# Patient Record
Sex: Female | Born: 1965 | Race: Black or African American | Hispanic: No | Marital: Married | Smoking: Never smoker
Health system: Southern US, Community
[De-identification: ages and names within clinical notes are randomized; demographics above are authoritative.]

## PROBLEM LIST (undated history)

## (undated) DIAGNOSIS — Z86018 Personal history of other benign neoplasm: Secondary | ICD-10-CM

## (undated) DIAGNOSIS — I10 Essential (primary) hypertension: Secondary | ICD-10-CM

## (undated) DIAGNOSIS — J3800 Paralysis of vocal cords and larynx, unspecified: Secondary | ICD-10-CM

## (undated) DIAGNOSIS — K219 Gastro-esophageal reflux disease without esophagitis: Secondary | ICD-10-CM

## (undated) DIAGNOSIS — E039 Hypothyroidism, unspecified: Secondary | ICD-10-CM

## (undated) HISTORY — PX: REPLACEMENT TOTAL KNEE: SUR1224

---

## 2021-03-06 ENCOUNTER — Emergency Department (HOSPITAL_COMMUNITY)
Admission: EM | Admit: 2021-03-06 | Discharge: 2021-03-06 | Disposition: A | Discharge status: Home / Self Care | Payer: Medicaid - Out of State | Attending: Emergency Medicine | Admitting: Emergency Medicine

## 2021-03-06 ENCOUNTER — Other Ambulatory Visit: Payer: Self-pay

## 2021-03-06 ENCOUNTER — Emergency Department (HOSPITAL_COMMUNITY): Payer: Medicaid - Out of State

## 2021-03-06 ENCOUNTER — Encounter (HOSPITAL_COMMUNITY): Payer: Self-pay | Admitting: *Deleted

## 2021-03-06 DIAGNOSIS — K219 Gastro-esophageal reflux disease without esophagitis: Secondary | ICD-10-CM | POA: Diagnosis not present

## 2021-03-06 DIAGNOSIS — I10 Essential (primary) hypertension: Secondary | ICD-10-CM | POA: Diagnosis not present

## 2021-03-06 DIAGNOSIS — R1031 Right lower quadrant pain: Secondary | ICD-10-CM

## 2021-03-06 DIAGNOSIS — E039 Hypothyroidism, unspecified: Secondary | ICD-10-CM | POA: Insufficient documentation

## 2021-03-06 DIAGNOSIS — R102 Pelvic and perineal pain: Secondary | ICD-10-CM | POA: Insufficient documentation

## 2021-03-06 HISTORY — DX: Gastro-esophageal reflux disease without esophagitis: K21.9

## 2021-03-06 HISTORY — DX: Essential (primary) hypertension: I10

## 2021-03-06 HISTORY — DX: Paralysis of vocal cords and larynx, unspecified: J38.00

## 2021-03-06 HISTORY — DX: Personal history of other benign neoplasm: Z86.018

## 2021-03-06 HISTORY — DX: Hypothyroidism, unspecified: E03.9

## 2021-03-06 LAB — COMPREHENSIVE METABOLIC PANEL
ALT: 25 U/L (ref 0–44)
AST: 21 U/L (ref 15–41)
Albumin: 4.3 g/dL (ref 3.5–5.0)
Alkaline Phosphatase: 91 U/L (ref 38–126)
Anion gap: 11 (ref 5–15)
BUN: 26 mg/dL — ABNORMAL HIGH (ref 6–20)
CO2: 27 mmol/L (ref 22–32)
Calcium: 9.3 mg/dL (ref 8.9–10.3)
Chloride: 97 mmol/L — ABNORMAL LOW (ref 98–111)
Creatinine, Ser: 0.95 mg/dL (ref 0.44–1.00)
GFR, Estimated: >60 mL/min (ref >60)
Glucose, Bld: 121 mg/dL — ABNORMAL HIGH (ref 70–99)
Potassium: 3.9 mmol/L (ref 3.5–5.1)
Sodium: 135 mmol/L (ref 135–145)
Total Bilirubin: 0.6 mg/dL (ref 0.3–1.2)
Total Protein: 8.9 g/dL — ABNORMAL HIGH (ref 6.5–8.1)

## 2021-03-06 LAB — CBC WITH DIFFERENTIAL/PLATELET
Abs Immature Granulocytes: 0.02 10*3/uL (ref 0.00–0.07)
Basophils Absolute: 0 10*3/uL (ref 0.0–0.1)
Basophils Relative: 1 %
Eosinophils Absolute: 0.1 10*3/uL (ref 0.0–0.5)
Eosinophils Relative: 1 %
HCT: 40.1 % (ref 36.0–46.0)
Hemoglobin: 12.8 g/dL (ref 12.0–15.0)
Immature Granulocytes: 0 %
Lymphocytes Relative: 41 %
Lymphs Abs: 2.4 10*3/uL (ref 0.7–4.0)
MCH: 29 pg (ref 26.0–34.0)
MCHC: 31.9 g/dL (ref 30.0–36.0)
MCV: 90.9 fL (ref 80.0–100.0)
Monocytes Absolute: 0.5 10*3/uL (ref 0.1–1.0)
Monocytes Relative: 8 %
Neutro Abs: 2.9 10*3/uL (ref 1.7–7.7)
Neutrophils Relative %: 49 %
Platelets: 186 10*3/uL (ref 150–400)
RBC: 4.41 MIL/uL (ref 3.87–5.11)
RDW: 13.1 % (ref 11.5–15.5)
WBC: 5.9 10*3/uL (ref 4.0–10.5)
nRBC: 0 % (ref 0.0–0.2)

## 2021-03-06 LAB — URINALYSIS, ROUTINE W REFLEX MICROSCOPIC
Bilirubin Urine: NEGATIVE
Glucose, UA: NEGATIVE mg/dL
Hgb urine dipstick: NEGATIVE
Ketones, ur: NEGATIVE mg/dL
Leukocytes,Ua: NEGATIVE
Nitrite: NEGATIVE
Protein, ur: NEGATIVE mg/dL
Specific Gravity, Urine: 1.014 (ref 1.005–1.030)
pH: 6 (ref 5.0–8.0)

## 2021-03-06 MED ORDER — HYDROCODONE-ACETAMINOPHEN 5-325 MG PO TABS: 1.0000 | ORAL_TABLET | ORAL | 0 refills | Status: AC | PRN

## 2021-03-06 MED ORDER — FENTANYL CITRATE (PF) 100 MCG/2ML IJ SOLN
50.0000 ug | INTRAMUSCULAR | Status: DC | PRN
  Administered 2021-03-06: 50 ug via INTRAVENOUS
  Filled 2021-03-06: qty 2

## 2021-03-06 MED ORDER — IOHEXOL 300 MG/ML  SOLN
100.0000 mL | Freq: Once | INTRAMUSCULAR | Status: AC | PRN
  Administered 2021-03-06: 100 mL via INTRAVENOUS

## 2021-03-06 NOTE — Discharge Instructions (Addendum)
Follow-up closely with your doctor.  If CT scan did not show any concerning findings at this time.  Your blood work was reassuring.  Stay well-hydrated, use Tylenol every 4 hours and if tolerated ibuprofen every 6 hours as needed for pain. Return for new or worsening signs or symptoms.

## 2021-03-06 NOTE — ED Provider Notes (Signed)
[facility] EMERGENCY DEPARTMENT Provider Note   CSN: [id number] Arrival date & time: 03/06/21  3664     History Chief Complaint  Patient presents with   Abdominal Pain    Heather Byrd is a 55 y.o. female.  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  Initially was intermittent no specific associations now fairly constant.  Patient unsure which ovary she has left.  Patient denies dysuria, vaginal discharge, vaginal bleeding.  History of ovarian cyst.  No fevers chills or vomiting.  No infectious symptoms or shortness of breath.      Past Medical History:  Diagnosis Date   Acid reflux    History of uterine fibroid    Hypertension    Hypothyroid    Paralyzed vocal cords     There are no problems to display for this patient.   Past Surgical History:  Procedure Laterality Date   CESAREAN SECTION     REPLACEMENT TOTAL KNEE Left      OB History   No obstetric history on file.     No family history on file.  Social History   Tobacco Use   Smoking status: Never   Smokeless tobacco: Never  Vaping Use   Vaping Use: Never used  Substance Use Topics   Alcohol use: Yes    Comment: 3-4 drinks on days she's off work   Drug use: Never    Home Medications Prior to Admission medications   Not on File    Allergies    Patient has no known allergies.  Review of Systems   Review of Systems  Constitutional:  Negative for chills and fever.  HENT:  Negative for congestion.   Eyes:  Negative for visual disturbance.  Respiratory:  Negative for shortness of breath.   Cardiovascular:  Negative for chest pain.  Gastrointestinal:  Positive for abdominal pain. Negative for vomiting.  Genitourinary:  Negative for dysuria and flank pain.  Musculoskeletal:   Negative for back pain, neck pain and neck stiffness.  Skin:  Negative for rash.  Neurological:  Negative for light-headedness and headaches.   Physical Exam Updated Vital Signs BP (!) 124/94   Pulse 64   Temp 97.6 F (36.4 C) (Oral)   Resp (!) 21   Ht 6' (1.829 m)   Wt 136.1 kg   SpO2 98%   BMI 40.69 kg/m   Physical Exam Vitals and nursing note reviewed.  Constitutional:      General: She is not in acute distress.    Appearance: She is well-developed.  HENT:     Head: Normocephalic and atraumatic.     Mouth/Throat:     Mouth: Mucous membranes are moist.  Eyes:     General:        Right eye: No discharge.        Left eye: No discharge.     Conjunctiva/sclera: Conjunctivae normal.  Neck:     Trachea: No tracheal deviation.  Cardiovascular:     Rate and Rhythm: Normal rate and regular rhythm.     Heart sounds: No murmur heard. Pulmonary:     Effort: Pulmonary effort is normal.     Breath sounds: Normal breath sounds.  Abdominal:     General: There is no distension.     Palpations: Abdomen is soft.  Tenderness: There is abdominal tenderness in the right lower quadrant. There is no guarding.  Musculoskeletal:     Cervical back: Normal range of motion and neck supple. No rigidity.  Skin:    General: Skin is warm.     Capillary Refill: Capillary refill takes less than 2 seconds.     Findings: No rash.  Neurological:     General: No focal deficit present.     Mental Status: She is alert.     Cranial Nerves: No cranial nerve deficit.  Psychiatric:        Mood and Affect: Mood normal.    ED Results / Procedures / Treatments   Labs (all labs ordered are listed, but only abnormal results are displayed) Labs Reviewed  COMPREHENSIVE METABOLIC PANEL - Abnormal; Notable for the following components:      Result Value   Chloride 97 (*)    Glucose, Bld 121 (*)    BUN 26 (*)    Total Protein 8.9 (*)    All other components within normal limits  URINALYSIS, ROUTINE  W REFLEX MICROSCOPIC - Abnormal; Notable for the following components:   APPearance CLOUDY (*)    All other components within normal limits  CBC WITH DIFFERENTIAL/PLATELET    EKG None  Radiology CT ABDOMEN PELVIS W CONTRAST  Result Date: 03/06/2021 CLINICAL DATA:  Right lower quadrant pain with appendicitis suspected EXAM: CT ABDOMEN AND PELVIS WITH CONTRAST TECHNIQUE: Multidetector CT imaging of the abdomen and pelvis was performed using the standard protocol following bolus administration of intravenous contrast. CONTRAST:  OMNIPAQUE IOHEXOL 300 MG/ML  SOLN COMPARISON:  None. FINDINGS: Lower chest: No acute finding. Mild scar-like appearance at the right middle lobe. Hepatobiliary: No focal liver abnormality.No evidence of biliary obstruction or stone. Pancreas: Unremarkable. Spleen: Unremarkable. Adrenals/Urinary Tract: Negative adrenals. No hydronephrosis or stone. Small cystic density in the lower pole right kidney. Unremarkable bladder. Stomach/Bowel:  No obstruction. No appendicitis. Vascular/Lymphatic: No acute vascular abnormality. No mass or adenopathy. Reproductive:Hysterectomy Other: No ascites or pneumoperitoneum. Musculoskeletal: No acute abnormalities. 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 IMPRESSION: No acute finding or explanation for symptoms.  Normal appendix. Electronically Signed   By: Marnee Spring M.D.   On: 03/06/2021 09:30    Procedures Procedures   Medications Ordered in ED Medications  fentaNYL (SUBLIMAZE) injection 50 mcg (50 mcg Intravenous Given 03/06/21 0755)  iohexol (OMNIPAQUE) 300 MG/ML solution 100 mL (100 mLs Intravenous Contrast Given 03/06/21 9678)    ED Course  I have reviewed the triage vital signs and the nursing notes.  Pertinent labs & imaging results that were available during my care of the patient were reviewed by me and considered in my medical decision making (see chart for  details).    MDM Rules/Calculators/A&P                           Patient with partial hysterectomy history presents with worsening pressure and pain right lower abdominal pelvic area including bladder.  Discussed differential diagnosis including urine infection, constipation, kidney stone, tumor related, appendicitis, other.  Plan for blood work and CT scan for further delineation given worsening signs and symptoms.  Fentanyl ordered for pain.  Urine pending.  Urinalysis reviewed no signs of significant bleeding or infection.  Blood work reviewed normal white blood cell count, normal hemoglobin, electrolytes unremarkable.  Pain meds given in the ER.  CT scan results reviewed showing  no acute abnormalities no appendicitis, no bowel obstruction.  Patient stable for follow-up with primary doctor as needed gastroenterology for further work-up. Final Clinical Impression(s) / ED Diagnoses Final diagnoses:  Right lower quadrant abdominal pain    Rx / DC Orders ED Discharge Orders     None        Blane Ohara, MD 03/06/21 1014

## 2021-03-06 NOTE — ED Triage Notes (Signed)
Pt c/o right lower abdominal pain x 3 days. Denies n/v/d, dysuria, vaginal discharge/bleeding. Reports hx of ovarian cysts.

## 2021-07-18 ENCOUNTER — Other Ambulatory Visit: Payer: Self-pay

## 2021-07-18 ENCOUNTER — Encounter (HOSPITAL_COMMUNITY): Payer: Self-pay | Admitting: *Deleted

## 2021-07-18 ENCOUNTER — Emergency Department (HOSPITAL_COMMUNITY)
Admission: EM | Admit: 2021-07-18 | Discharge: 2021-07-18 | Disposition: A | Discharge status: Home / Self Care | Payer: Medicaid - Out of State | Attending: Emergency Medicine | Admitting: Emergency Medicine

## 2021-07-18 ENCOUNTER — Emergency Department (HOSPITAL_COMMUNITY): Payer: Medicaid - Out of State

## 2021-07-18 DIAGNOSIS — E039 Hypothyroidism, unspecified: Secondary | ICD-10-CM | POA: Diagnosis not present

## 2021-07-18 DIAGNOSIS — R1084 Generalized abdominal pain: Secondary | ICD-10-CM | POA: Diagnosis present

## 2021-07-18 DIAGNOSIS — K529 Noninfective gastroenteritis and colitis, unspecified: Secondary | ICD-10-CM | POA: Insufficient documentation

## 2021-07-18 DIAGNOSIS — Z96652 Presence of left artificial knee joint: Secondary | ICD-10-CM | POA: Diagnosis not present

## 2021-07-18 DIAGNOSIS — E876 Hypokalemia: Secondary | ICD-10-CM | POA: Insufficient documentation

## 2021-07-18 DIAGNOSIS — I10 Essential (primary) hypertension: Secondary | ICD-10-CM | POA: Insufficient documentation

## 2021-07-18 LAB — CBC WITH DIFFERENTIAL/PLATELET
Abs Immature Granulocytes: 0.03 10*3/uL (ref 0.00–0.07)
Basophils Absolute: 0 10*3/uL (ref 0.0–0.1)
Basophils Relative: 0 %
Eosinophils Absolute: 0 10*3/uL (ref 0.0–0.5)
Eosinophils Relative: 0 %
HCT: 41.9 % (ref 36.0–46.0)
Hemoglobin: 13.7 g/dL (ref 12.0–15.0)
Immature Granulocytes: 0 %
Lymphocytes Relative: 14 %
Lymphs Abs: 1.2 10*3/uL (ref 0.7–4.0)
MCH: 29.6 pg (ref 26.0–34.0)
MCHC: 32.7 g/dL (ref 30.0–36.0)
MCV: 90.5 fL (ref 80.0–100.0)
Monocytes Absolute: 0.3 10*3/uL (ref 0.1–1.0)
Monocytes Relative: 3 %
Neutro Abs: 7.1 10*3/uL (ref 1.7–7.7)
Neutrophils Relative %: 83 %
Platelets: 247 10*3/uL (ref 150–400)
RBC: 4.63 MIL/uL (ref 3.87–5.11)
RDW: 13 % (ref 11.5–15.5)
WBC: 8.6 10*3/uL (ref 4.0–10.5)
nRBC: 0 % (ref 0.0–0.2)

## 2021-07-18 LAB — COMPREHENSIVE METABOLIC PANEL
ALT: 30 U/L (ref 0–44)
AST: 27 U/L (ref 15–41)
Albumin: 4.6 g/dL (ref 3.5–5.0)
Alkaline Phosphatase: 94 U/L (ref 38–126)
Anion gap: 13 (ref 5–15)
BUN: 15 mg/dL (ref 6–20)
CO2: 28 mmol/L (ref 22–32)
Calcium: 9.7 mg/dL (ref 8.9–10.3)
Chloride: 98 mmol/L (ref 98–111)
Creatinine, Ser: 0.93 mg/dL (ref 0.44–1.00)
GFR, Estimated: >60 mL/min (ref >60)
Glucose, Bld: 156 mg/dL — ABNORMAL HIGH (ref 70–99)
Potassium: 3.1 mmol/L — ABNORMAL LOW (ref 3.5–5.1)
Sodium: 139 mmol/L (ref 135–145)
Total Bilirubin: 0.7 mg/dL (ref 0.3–1.2)
Total Protein: 9.1 g/dL — ABNORMAL HIGH (ref 6.5–8.1)

## 2021-07-18 LAB — URINALYSIS, MICROSCOPIC (REFLEX)

## 2021-07-18 LAB — URINALYSIS, ROUTINE W REFLEX MICROSCOPIC
Bilirubin Urine: NEGATIVE
Glucose, UA: NEGATIVE mg/dL
Hgb urine dipstick: NEGATIVE
Ketones, ur: NEGATIVE mg/dL
Leukocytes,Ua: NEGATIVE
Nitrite: NEGATIVE
Specific Gravity, Urine: 1.01 (ref 1.005–1.030)
pH: 5.5 (ref 5.0–8.0)

## 2021-07-18 LAB — LIPASE, BLOOD: Lipase: 36 U/L (ref 11–51)

## 2021-07-18 MED ORDER — HYDROCODONE-ACETAMINOPHEN 5-325 MG PO TABS
ORAL_TABLET | ORAL | 0 refills | Status: AC
Start: 2021-07-18 — End: ?

## 2021-07-18 MED ORDER — IOHEXOL 300 MG/ML  SOLN
100.0000 mL | Freq: Once | INTRAMUSCULAR | Status: AC | PRN
  Administered 2021-07-18: 100 mL via INTRAVENOUS

## 2021-07-18 MED ORDER — HYDROMORPHONE HCL 1 MG/ML IJ SOLN
0.5000 mg | Freq: Once | INTRAMUSCULAR | Status: AC
  Administered 2021-07-18: 0.5 mg via INTRAVENOUS
  Filled 2021-07-18: qty 1

## 2021-07-18 MED ORDER — SODIUM CHLORIDE 0.9 % IV BOLUS
1000.0000 mL | Freq: Once | INTRAVENOUS | Status: AC
Start: 2021-07-18 — End: 2021-07-18
  Administered 2021-07-18: 1000 mL via INTRAVENOUS

## 2021-07-18 MED ORDER — ONDANSETRON HCL 4 MG/2ML IJ SOLN
4.0000 mg | Freq: Once | INTRAMUSCULAR | Status: AC
  Administered 2021-07-18: 4 mg via INTRAVENOUS
  Filled 2021-07-18: qty 2

## 2021-07-18 MED ORDER — HYDROMORPHONE HCL 1 MG/ML IJ SOLN
1.0000 mg | Freq: Once | INTRAMUSCULAR | Status: AC
Start: 2021-07-18 — End: 2021-07-18
  Administered 2021-07-18: 1 mg via INTRAVENOUS
  Filled 2021-07-18: qty 1

## 2021-07-18 MED ORDER — ONDANSETRON HCL 4 MG PO TABS: 4.0000 mg | ORAL_TABLET | Freq: Four times a day (QID) | ORAL | 0 refills | Status: AC

## 2021-07-18 MED ORDER — POTASSIUM CHLORIDE CRYS ER 20 MEQ PO TBCR
40.0000 meq | EXTENDED_RELEASE_TABLET | Freq: Once | ORAL | Status: AC
  Administered 2021-07-18: 40 meq via ORAL
  Filled 2021-07-18: qty 2

## 2021-07-18 NOTE — ED Provider Notes (Signed)
[facility] EMERGENCY DEPARTMENT Provider Note   CSN: [id number] Arrival date & time: 07/18/21  1026     History Chief Complaint  Patient presents with   Abdominal Pain    Heather Byrd is a 55 y.o. female.   Abdominal Pain Associated symptoms: nausea and vomiting   Associated symptoms: no chest pain, no chills, no cough, no diarrhea, no dysuria, no fatigue, no fever and no shortness of breath        Heather Byrd is a 55 y.o. female who presents to the Emergency Department complaining of sudden onset of abdominal pain, nausea, and vomiting symptoms began around 5 PM last evening.  She reports multiple episodes of vomiting throughout the night and unable to tolerate any solid foods or liquids.  She describes diffuse cramping type pain to her entire abdomen.  She notes history of similar episode in the past and was told she had "an issue with her kidneys" she was seen by nephrology and told that she did not have an issue with her kidneys and that her prior episode was likely related to dehydration.  No recent illness, fever, chills, dysuria, chest pain or shortness of breath.  No diarrhea.  No alleviating factors.  Past Medical History:  Diagnosis Date   Acid reflux    History of uterine fibroid    Hypertension    Hypothyroid    Paralyzed vocal cords     There are no problems to display for this patient.   Past Surgical History:  Procedure Laterality Date   CESAREAN SECTION     REPLACEMENT TOTAL KNEE Left      OB History   No obstetric history on file.     No family history on file.  Social History   Tobacco Use   Smoking status: Never   Smokeless tobacco: Never  Vaping Use   Vaping Use: Never used  Substance Use Topics   Alcohol use: Yes    Comment: 3-4 drinks on days she's off work   Drug use: Never    Home Medications Prior to Admission medications   Not on File    Allergies    Patient has no known allergies.  Review of Systems   Review of  Systems  Constitutional:  Negative for chills, fatigue and fever.  HENT:  Negative for trouble swallowing.   Respiratory:  Negative for cough and shortness of breath.   Cardiovascular:  Negative for chest pain.  Gastrointestinal:  Positive for abdominal pain, nausea and vomiting. Negative for blood in stool and diarrhea.  Genitourinary:  Negative for difficulty urinating and dysuria.  Musculoskeletal:  Negative for back pain, myalgias, neck pain and neck stiffness.  Skin:  Negative for rash.  Neurological:  Negative for dizziness, syncope, weakness and numbness.  Hematological:  Does not bruise/bleed easily.  All other systems reviewed and are negative.  Physical Exam Updated Vital Signs BP (!) 135/102 (BP Location: Right Arm)   Pulse (!) 105   Temp 98.2 F (36.8 C) (Oral)   Resp 18   Ht 5\' 11"  (1.803 m)   Wt 136.1 kg   SpO2 93%   BMI 41.84 kg/m   Physical Exam Vitals and nursing note reviewed.  Constitutional:      Appearance: Normal appearance. She is obese. She is not toxic-appearing.     Comments: Uncomfortable appearing, nontoxic  HENT:     Head: Normocephalic.     Mouth/Throat:     Mouth: Mucous membranes are moist.  Eyes:     Pupils: Pupils are equal, round, and reactive to light.  Cardiovascular:     Rate and Rhythm: Normal rate and regular rhythm.     Heart sounds: Normal heart sounds.  Pulmonary:     Effort: Pulmonary effort is normal.     Breath sounds: Normal breath sounds. No wheezing.  Abdominal:     Palpations: Abdomen is soft.     Tenderness: There is generalized abdominal tenderness. There is no guarding or rebound.  Musculoskeletal:        General: Normal range of motion.  Skin:    General: Skin is warm.     Findings: No rash.  Neurological:     Mental Status: She is alert and oriented to person, place, and time.     Motor: No weakness.    ED Results / Procedures / Treatments   Labs (all labs ordered are listed, but only abnormal results are  displayed) Labs Reviewed  COMPREHENSIVE METABOLIC PANEL - Abnormal; Notable for the following components:      Result Value   Potassium 3.1 (*)    Glucose, Bld 156 (*)    Total Protein 9.1 (*)    All other components within normal limits  URINALYSIS, ROUTINE W REFLEX MICROSCOPIC - Abnormal; Notable for the following components:   Protein, ur TRACE (*)    All other components within normal limits  URINALYSIS, MICROSCOPIC (REFLEX) - Abnormal; Notable for the following components:   Bacteria, UA RARE (*)    All other components within normal limits  LIPASE, BLOOD  CBC WITH DIFFERENTIAL/PLATELET    EKG None  Radiology CT ABDOMEN PELVIS W CONTRAST  Result Date: 07/18/2021 CLINICAL DATA:  Nausea, vomiting, upper abdominal pain with swelling EXAM: CT ABDOMEN AND PELVIS WITH CONTRAST TECHNIQUE: Multidetector CT imaging of the abdomen and pelvis was performed using the standard protocol following bolus administration of intravenous contrast. CONTRAST:  OMNIPAQUE IOHEXOL 300 MG/ML  SOLN COMPARISON:  CT abdomen/pelvis 03/06/2021 FINDINGS: Lower chest: Reticular opacities in the right middle lobe likely reflects scar. The lung bases are otherwise clear. The imaged heart is unremarkable. Hepatobiliary: The liver and gallbladder are unremarkable. There is no biliary ductal dilatation. Pancreas: Unremarkable. Spleen: Unremarkable. Adrenals/Urinary Tract: Adrenals are unremarkable. A hypodense lesion in the right interpolar region most likely reflects a cyst. There are no other focal lesions. Are no stones. There is no hydronephrosis or hydroureter. The bladder is decompressed. Stomach/Bowel: Hyperdensity in the dependent stomach most likely reflects ingested material. The stomach is otherwise unremarkable. There is borderline dilation of multiple loops of small bowel in the mid abdomen measuring up to 2.5 cm. The distal small bowel is decompressed, but no discrete transition point is identified. There  is no abnormal bowel wall thickening or inflammatory change. There is fluid throughout the colon which can be seen with diarrhea. Vascular/Lymphatic: The abdominal aorta is normal in course and caliber. The major branch vessels are patent. Main portal and splenic veins are patent. There is no abdominopelvic lymphadenopathy. Reproductive: The uterus is surgically absent. There is no adnexal mass. Other: There is haziness in the mesenteric fat surrounding the above-described mildly dilated small bowel. There is small volume ascites throughout the abdomen. There is no organized or drainable fluid collection. There is no free intraperitoneal air. Musculoskeletal: There is no acute osseous abnormality or aggressive osseous lesion. IMPRESSION: 1. Borderline dilated loops of small bowel in the mid abdomen with decompressed small bowel distally. No discrete transition point is identified,  but early or partial obstruction could have this appearance. 2. Small volume ascites in the abdomen, not seen on the prior study from 03/06/2021. 3. Fluid throughout the colon which can be seen with diarrhea. Electronically Signed   By: Lesia Hausen M.D.   On: 07/18/2021 13:52    Procedures Procedures   Medications Ordered in ED Medications  sodium chloride 0.9 % bolus 1,000 mL (has no administration in time range)  ondansetron (ZOFRAN) injection 4 mg (has no administration in time range)  HYDROmorphone (DILAUDID) injection 1 mg (has no administration in time range)    ED Course  I have reviewed the triage vital signs and the nursing notes.  Pertinent labs & imaging results that were available during my care of the patient were reviewed by me and considered in my medical decision making (see chart for details).    MDM Rules/Calculators/A&P                           Patient here for evaluation of diffuse abdominal pain since 5 PM last evening.  Pain is been associated with nausea and vomiting throughout the night.   Unable to tolerate solid foods or liquids.  On exam, patient is uncomfortable appearing but nontoxic.  Vital signs reassuring.  No active vomiting during my exam.  Diffuse abdominal pain.  Will obtain labs, administer IV fluids, antiemetics and obtain CT imaging abdomen pelvis.  Labs interpreted by me, no leukocytosis.  Lipase unremarkable.  Mild hypokalemia with potassium of 3.1 likely secondary to vomiting.  No other significant electrolyte abnormalities.  Urinalysis without evidence of infection.  CT abdomen and pelvis show mildly dilated loops of small bowel without discrete transition point identified.  On repeat exam, patient resting comfortably.  She is had 2 episodes of loose stools since my initial exam and since having CT.  1 additional episode of vomiting. IVF's given  On second repeat exam, patient now resting comfortably.  No further vomiting.  We will give oral fluid challenge.  Low clinical suspicion for SBO given her recent BMs.    Tolerating orals without difficulty.  Patient felt to be stable for discharge home, likely a viral process. agrees to clear fluids tonight and resume bland diet as tolerated.  She is agreeable to plan.  Prescription written for antiemetic.  Return precautions were discussed.  All questions answered.   Final Clinical Impression(s) / ED Diagnoses Final diagnoses:  Gastroenteritis    Rx / DC Orders ED Discharge Orders     None        Pauline Aus, PA-C 07/21/21 1430    Blane Ohara, MD 07/21/21 2354

## 2021-07-18 NOTE — Discharge Instructions (Signed)
Small frequent sips of clear fluids this evening.  Bland diet as tolerated starting tomorrow.  Follow-up with your primary care provider for recheck.  Return to the emergency department for any new or worsening symptoms.

## 2021-07-18 NOTE — ED Triage Notes (Signed)
Abdominal pain with vomiting 

## 2023-02-26 IMAGING — CT CT ABD-PELV W/ CM
2 of 5 series · 17 of 46 positions shown, 19 images · IV contrast (Omnipaque or Isovue)
Comparison: None.

CLINICAL DATA: Right lower quadrant pain with appendicitis
suspected

EXAM:
CT ABDOMEN AND PELVIS WITH CONTRAST
TECHNIQUE: Multidetector CT imaging of the abdomen and pelvis was performed
using the standard protocol following bolus administration of
intravenous contrast.
CONTRAST:  100mL OMNIPAQUE IOHEXOL 300 MG/ML  SOLN

[Series 2: axial st · axial · 0.88mm/px · z∈[+1018,+1453]mm · 14 of 101 slices shown, 16 images]
[im 7/101  soft-tissue]
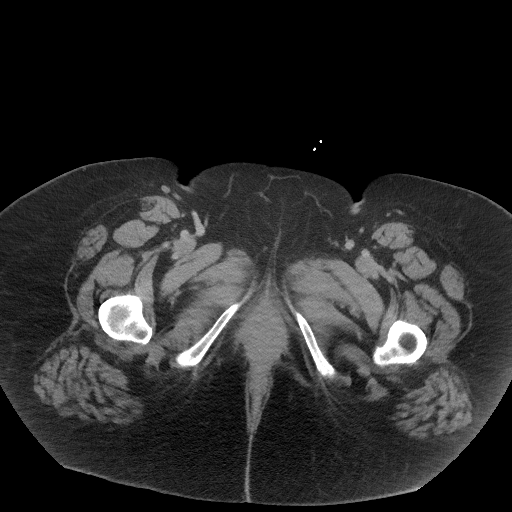
[im 7/101  bone]
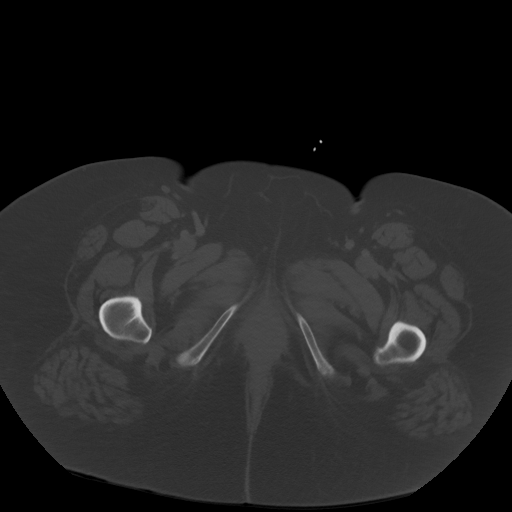
[im 13/101  soft-tissue]
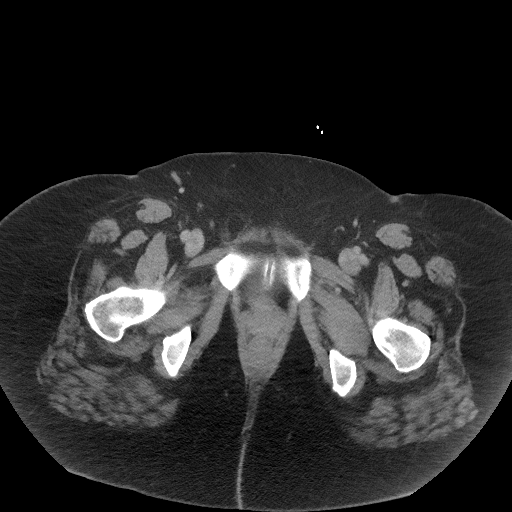
[im 19/101  soft-tissue]
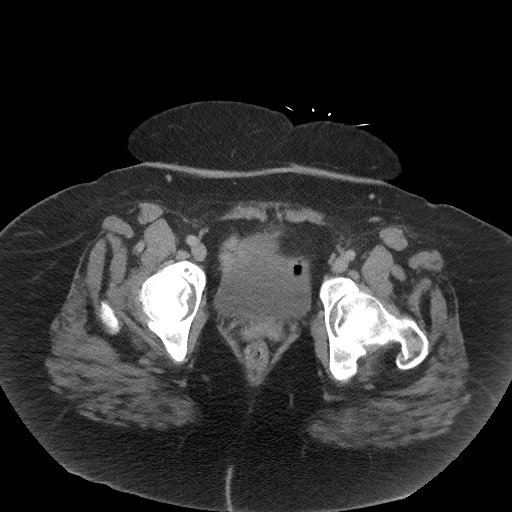
[im 26/101  soft-tissue]
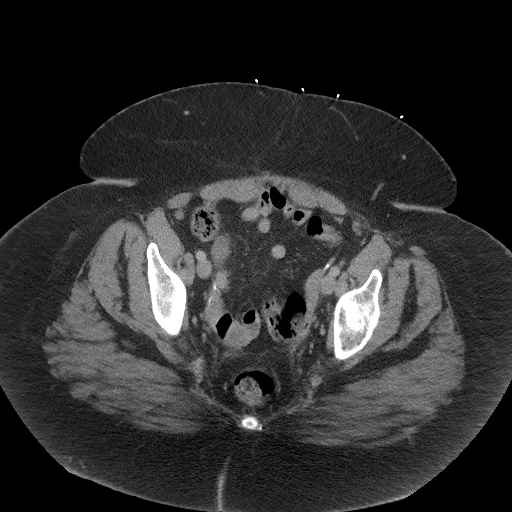
[im 32/101  soft-tissue]
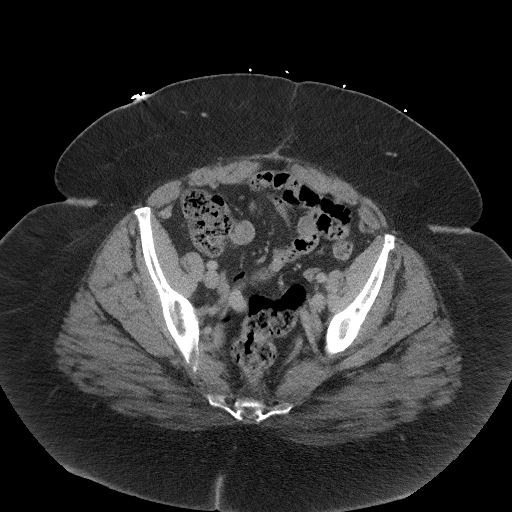
[im 38/101  soft-tissue]
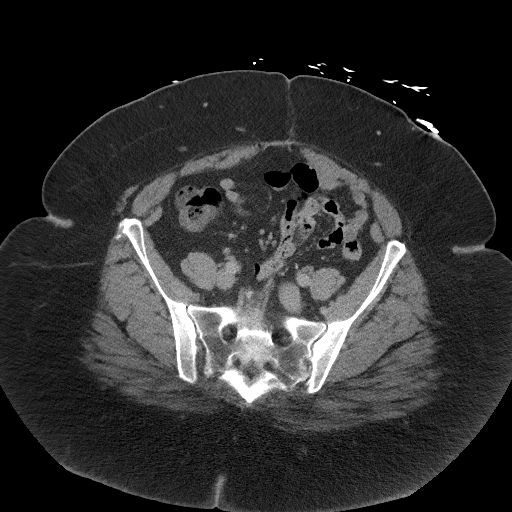
[im 44/101  soft-tissue]
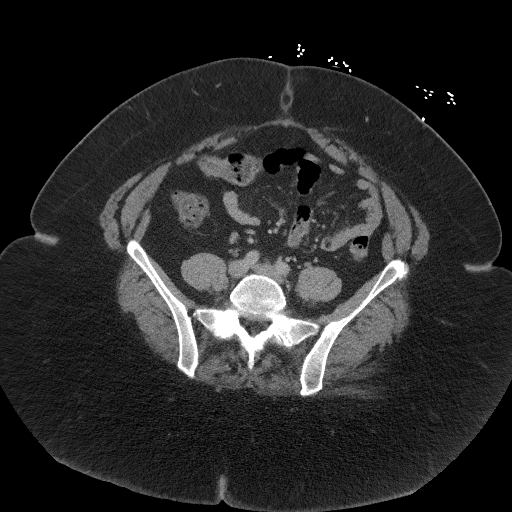
[im 57/101  soft-tissue]
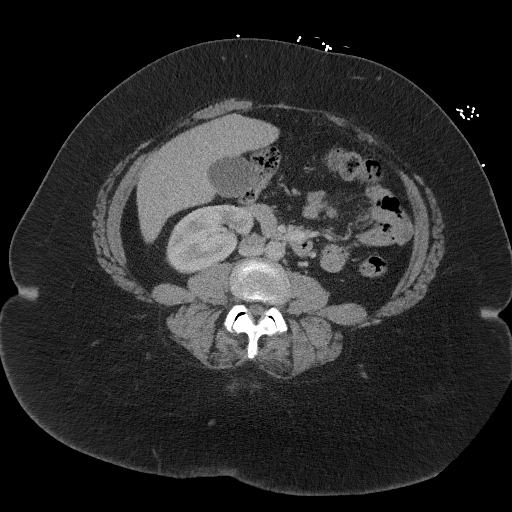
[im 63/101  soft-tissue]
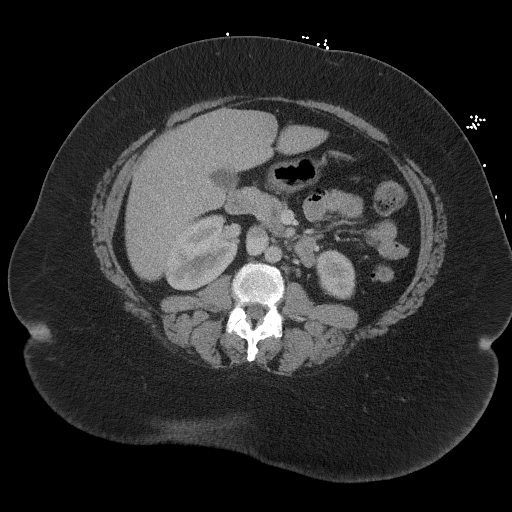
[im 63/101  bone]
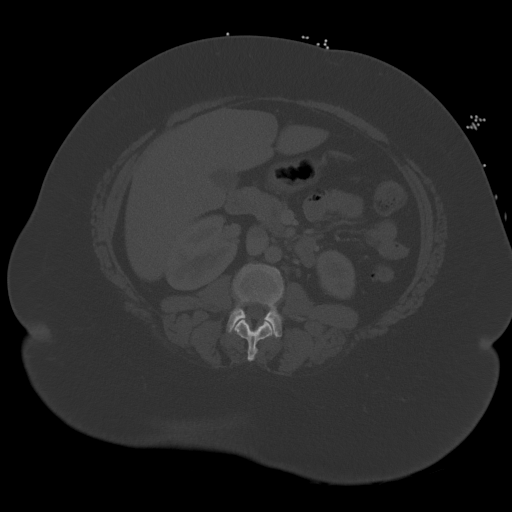
[im 69/101  soft-tissue]
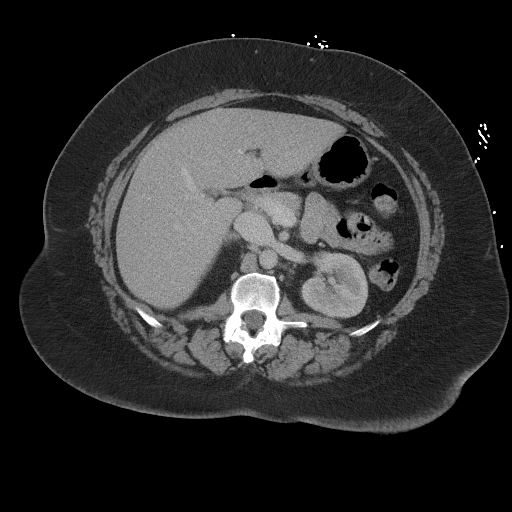
[im 76/101  soft-tissue]
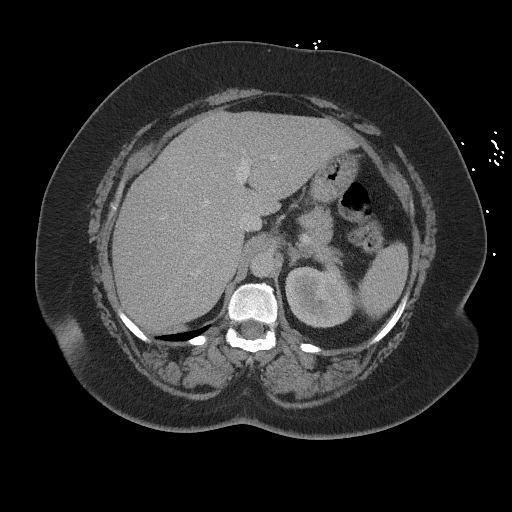
[im 82/101  soft-tissue]
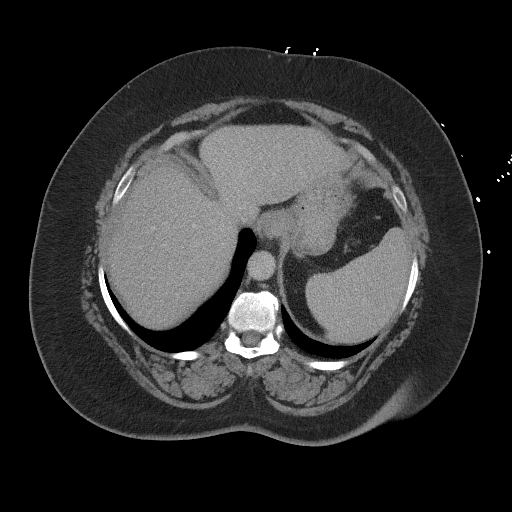
[im 88/101  soft-tissue]
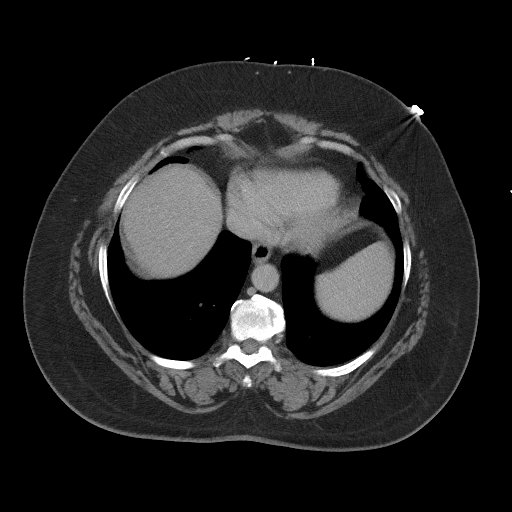
[im 94/101  soft-tissue]
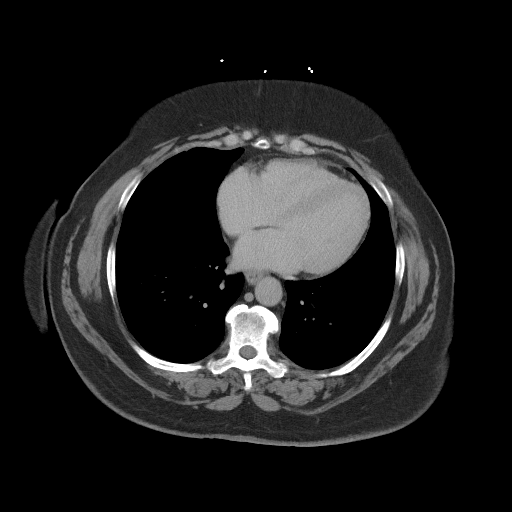

[Series 5: coronal st · coronal · 0.85mm/px · 3 of 141 slices shown]
[im 47/141  soft-tissue]
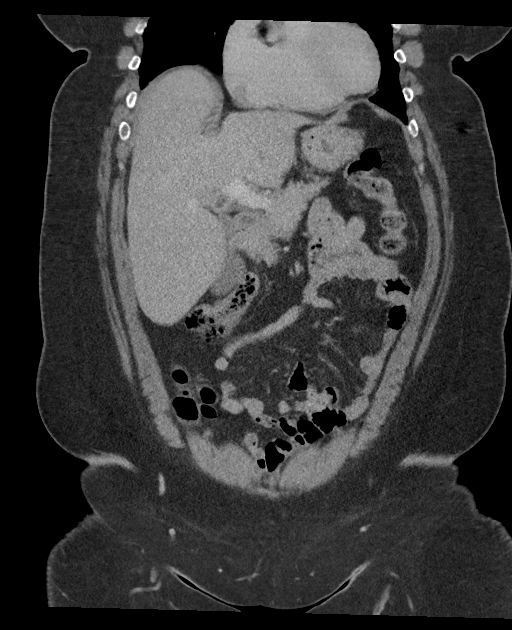
[im 63/141  soft-tissue]
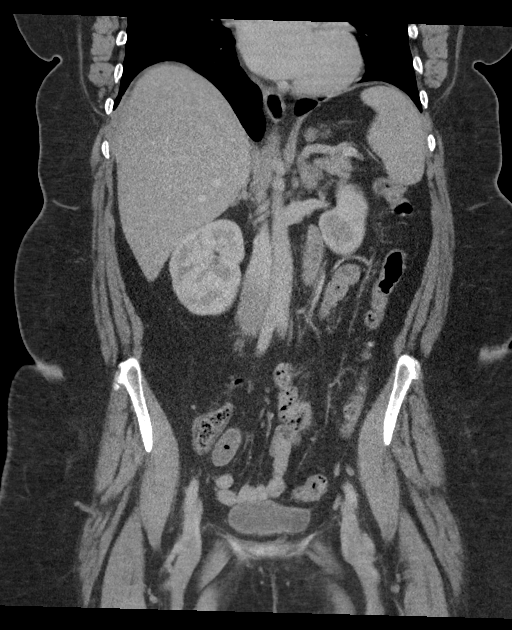
[im 78/141  soft-tissue]
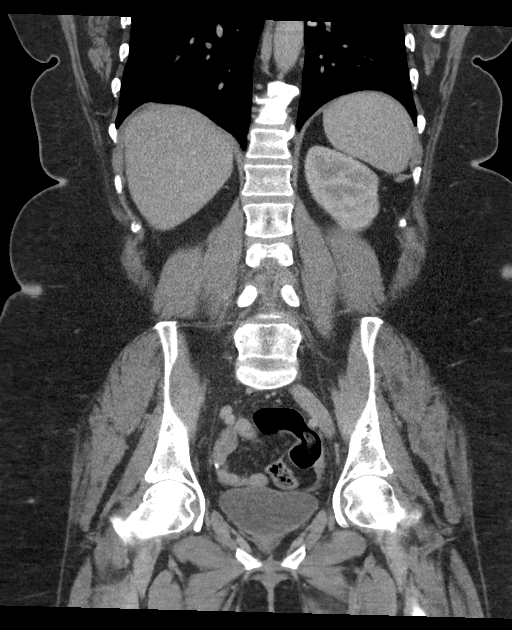

[17 of 46 positions shown; findings below may reference images not displayed]

FINDINGS: Lower chest: No acute finding. Mild scar-like appearance at the
right middle lobe.

Hepatobiliary: No focal liver abnormality.No evidence of biliary
obstruction or stone.

Pancreas: Unremarkable.

Spleen: Unremarkable.

Adrenals/Urinary Tract: Negative adrenals. No hydronephrosis or
stone. Small cystic density in the lower pole right kidney.
Unremarkable bladder.

Stomach/Bowel:  No obstruction. No appendicitis.

Vascular/Lymphatic: No acute vascular abnormality. No mass or
adenopathy.

Reproductive:Hysterectomy

Other: No ascites or pneumoperitoneum.

Musculoskeletal: No acute abnormalities. Mid to lower lumbar facet
osteoarthritis and epidural lipomatosis narrowing the thecal sac.
IMPRESSION: No acute finding or explanation for symptoms.  Normal appendix.

## 2023-07-10 IMAGING — CT CT ABD-PELV W/ CM
2 of 5 series · 16 of 46 positions shown, 18 images · IV contrast (Omnipaque or Isovue)
Comparison: CT abdomen/pelvis 03/06/2021

CLINICAL DATA: Nausea, vomiting, upper abdominal pain with swelling

EXAM:
CT ABDOMEN AND PELVIS WITH CONTRAST
TECHNIQUE: Multidetector CT imaging of the abdomen and pelvis was performed
using the standard protocol following bolus administration of
intravenous contrast.
CONTRAST:  100mL OMNIPAQUE IOHEXOL 300 MG/ML  SOLN

[Series 2: axial st · axial · 0.79mm/px · z∈[+904,+1414]mm · 13 of 115 slices shown, 15 images]
[im 7/115  soft-tissue]
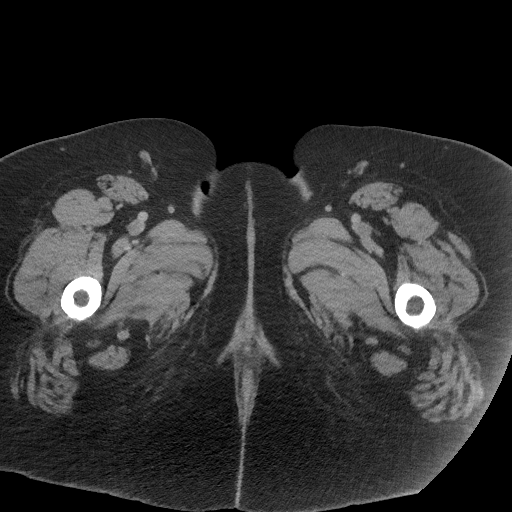
[im 7/115  bone]
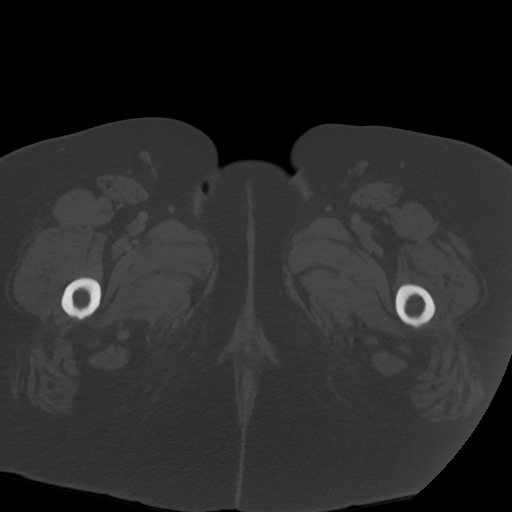
[im 19/115  soft-tissue]
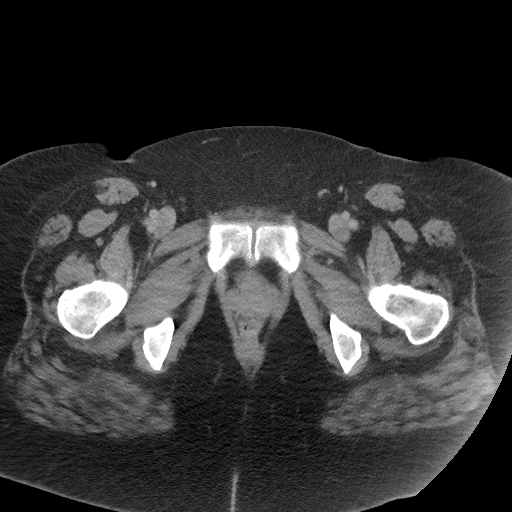
[im 25/115  soft-tissue]
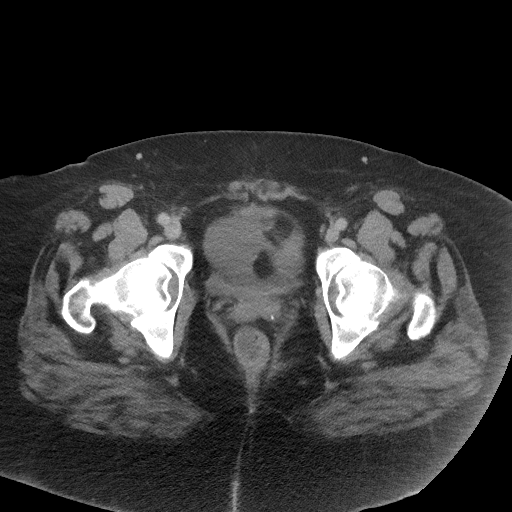
[im 31/115  soft-tissue]
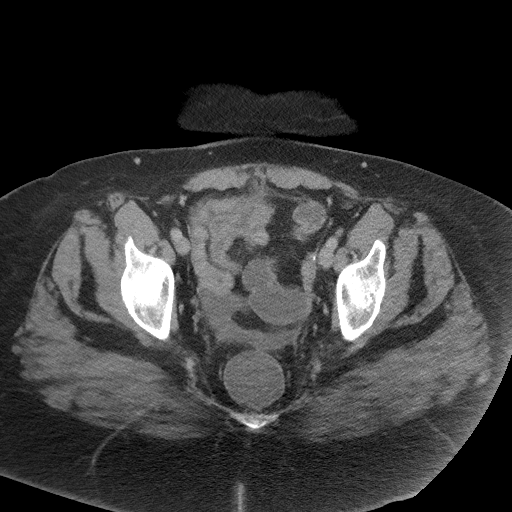
[im 43/115  soft-tissue]
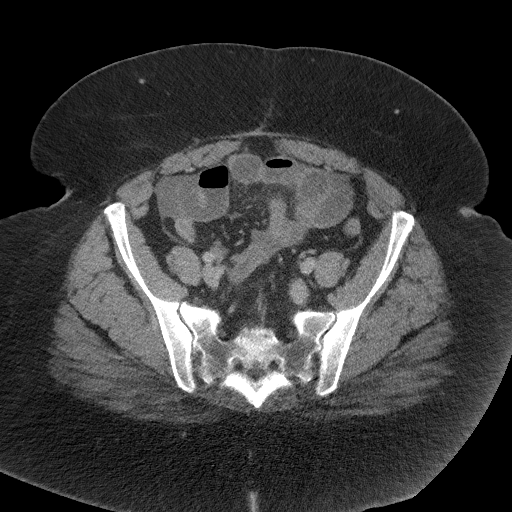
[im 49/115  soft-tissue]
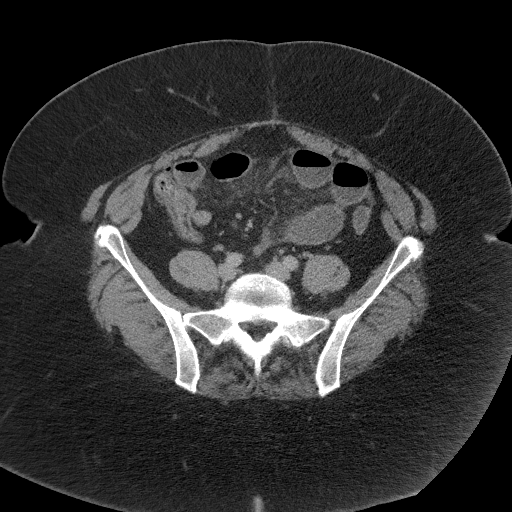
[im 61/115  soft-tissue]
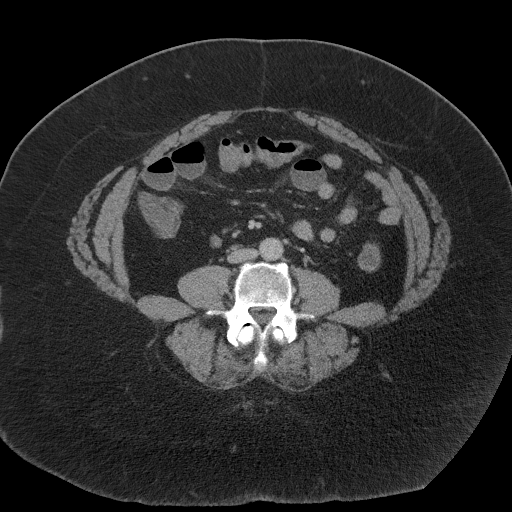
[im 67/115  soft-tissue]
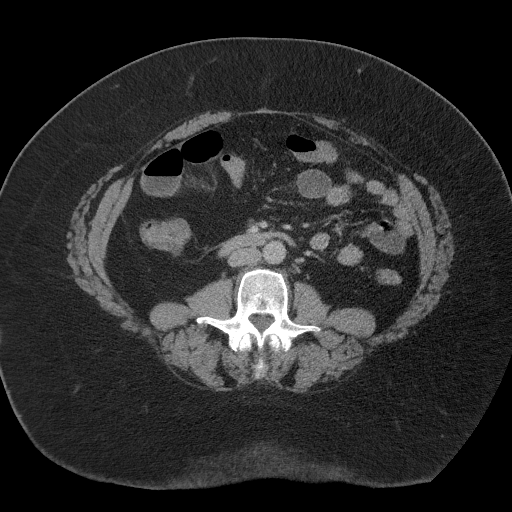
[im 73/115  soft-tissue]
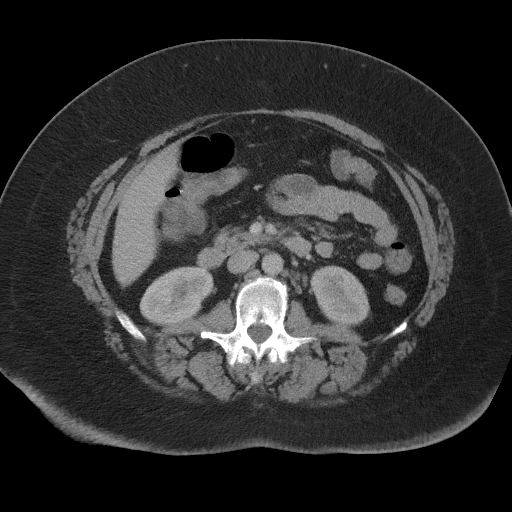
[im 73/115  bone]
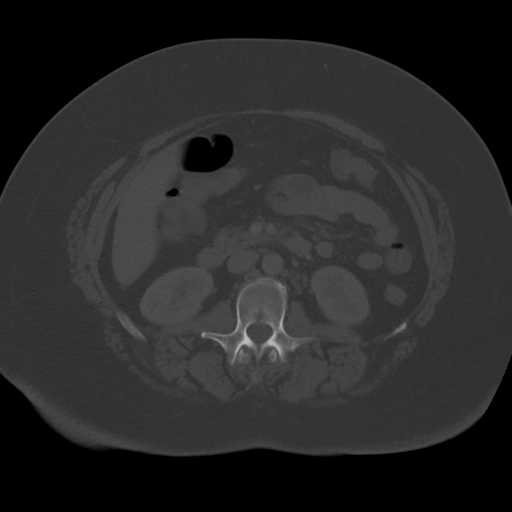
[im 85/115  soft-tissue]
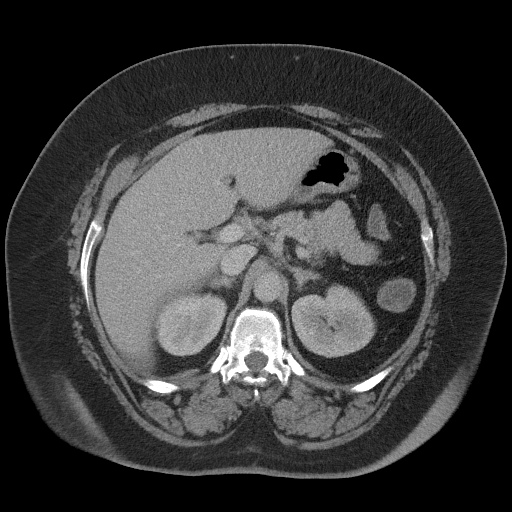
[im 91/115  soft-tissue]
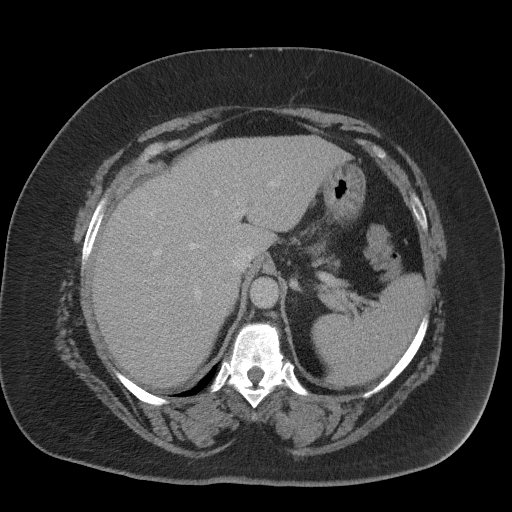
[im 97/115  soft-tissue]
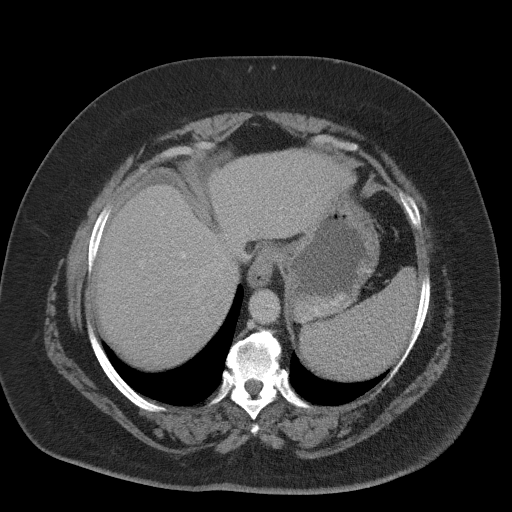
[im 109/115  soft-tissue]
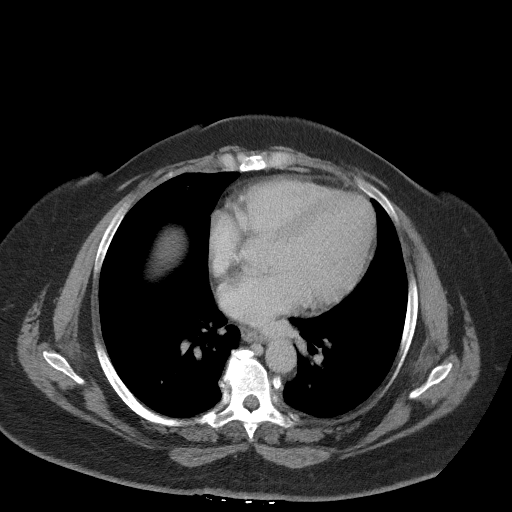

[Series 6: coronal st · coronal · 1.00mm/px · 3 of 117 slices shown]
[im 39/117  soft-tissue]
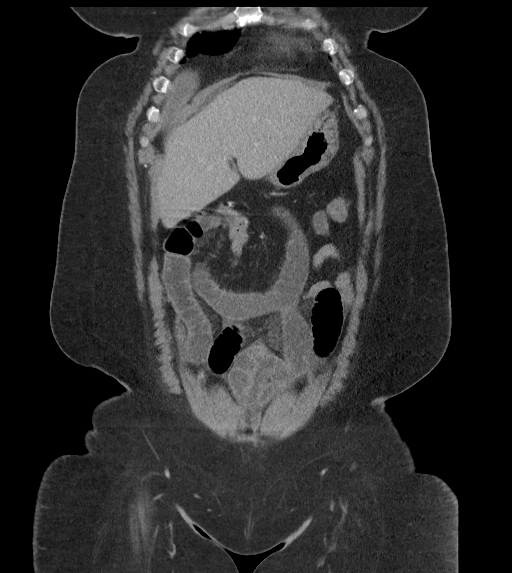
[im 52/117  soft-tissue]
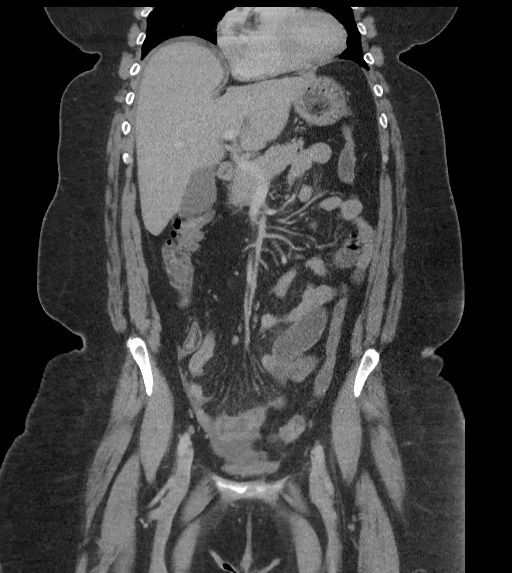
[im 65/117  soft-tissue]
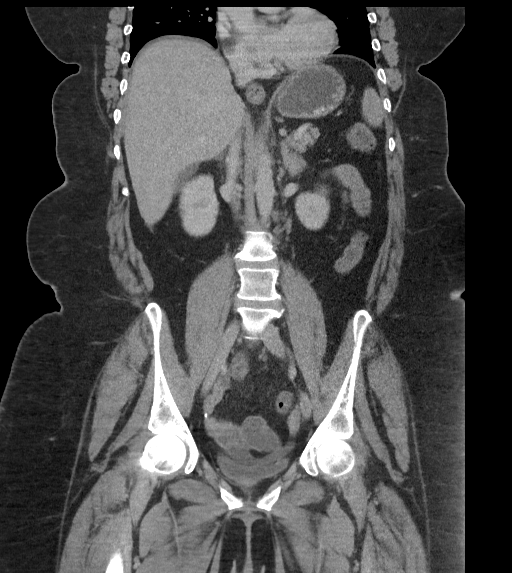

[16 of 46 positions shown; findings below may reference images not displayed]

FINDINGS: Lower chest: Reticular opacities in the right middle lobe likely
reflects scar. The lung bases are otherwise clear. The imaged heart
is unremarkable.

Hepatobiliary: The liver and gallbladder are unremarkable. There is
no biliary ductal dilatation.

Pancreas: Unremarkable.

Spleen: Unremarkable.

Adrenals/Urinary Tract: Adrenals are unremarkable.

A hypodense lesion in the right interpolar region most likely
reflects a cyst. There are no other focal lesions. Are no stones.
There is no hydronephrosis or hydroureter. The bladder is
decompressed.

Stomach/Bowel: Hyperdensity in the dependent stomach most likely
reflects ingested material. The stomach is otherwise unremarkable.
There is borderline dilation of multiple loops of small bowel in the
mid abdomen measuring up to 2.5 cm. The distal small bowel is
decompressed, but no discrete transition point is identified. There
is no abnormal bowel wall thickening or inflammatory change. There
is fluid throughout the colon which can be seen with diarrhea.

Vascular/Lymphatic: The abdominal aorta is normal in course and
caliber. The major branch vessels are patent. Main portal and
splenic veins are patent. There is no abdominopelvic
lymphadenopathy.

Reproductive: The uterus is surgically absent. There is no adnexal
mass.

Other: There is haziness in the mesenteric fat surrounding the
above-described mildly dilated small bowel. There is small volume
ascites throughout the abdomen. There is no organized or drainable
fluid collection. There is no free intraperitoneal air.

Musculoskeletal: There is no acute osseous abnormality or aggressive
osseous lesion.
IMPRESSION: 1. Borderline dilated loops of small bowel in the mid abdomen with
decompressed small bowel distally. No discrete transition point is
identified, but early or partial obstruction could have this
appearance.
2. Small volume ascites in the abdomen, not seen on the prior study
from 03/06/2021.
3. Fluid throughout the colon which can be seen with diarrhea.
